# Patient Record
Sex: Male | Born: 1995 | Race: Black or African American | Hispanic: No | Marital: Single | Smoking: Never smoker
Health system: Southern US, Community
[De-identification: ages and names within clinical notes are randomized; demographics above are authoritative.]

## PROBLEM LIST (undated history)

## (undated) DIAGNOSIS — M549 Dorsalgia, unspecified: Secondary | ICD-10-CM

---

## 2019-08-28 ENCOUNTER — Other Ambulatory Visit: Payer: Self-pay

## 2019-08-28 ENCOUNTER — Ambulatory Visit (HOSPITAL_COMMUNITY)
Admission: EM | Admit: 2019-08-28 | Discharge: 2019-08-28 | Disposition: A | Discharge status: Home / Self Care | Payer: Self-pay | Attending: Family Medicine | Admitting: Family Medicine

## 2019-08-28 ENCOUNTER — Encounter (HOSPITAL_COMMUNITY): Payer: Self-pay

## 2019-08-28 DIAGNOSIS — M2392 Unspecified internal derangement of left knee: Secondary | ICD-10-CM

## 2019-08-28 MED ORDER — IBUPROFEN 800 MG PO TABS: 800.0000 mg | ORAL_TABLET | Freq: Three times a day (TID) | ORAL | 0 refills | Status: AC

## 2019-08-28 NOTE — ED Triage Notes (Signed)
Pt states he has left knee pain.  Pt states it's been tightness pt states this has been going on for a month or more.

## 2019-08-28 NOTE — ED Provider Notes (Signed)
[facility]    CSN: [id number] Arrival date & time: 08/28/19  1334      History   Chief Complaint Chief Complaint  Patient presents with  . Knee Pain    HPI Michael Ruiz is a 23 y.o. male.   HPI  Patient states that while in high school he played soccer baseball and football.  He does recall taking some hits to the knees but no real knee injury.  Over the last couple of months he has been having increasing difficulty with his left knee.  It clicks.  It pops.  It locks.  When he changes position frequently it feels as though it is going to buckle.  Sometimes at the end of the day it swollen.  No new injury. History reviewed. No pertinent past medical history.  There are no active problems to display for this patient.   History reviewed. No pertinent surgical history.     Home Medications    Prior to Admission medications   Medication Sig Start Date End Date Taking? Authorizing Provider  ibuprofen (ADVIL) 800 MG tablet Take 1 tablet (800 mg total) by mouth 3 (three) times daily. 08/28/19   Eustace MooreNelson, [name], MD    Family History History reviewed. No pertinent family history.  Social History Social History   Tobacco Use  . Smoking status: Never Smoker  . Smokeless tobacco: Never Used  Substance Use Topics  . Alcohol use: Yes  . Drug use: Never     Allergies   Patient has no known allergies.   Review of Systems Review of Systems  Constitutional: Negative for chills and fever.  HENT: Negative for ear pain and sore throat.   Eyes: Negative for pain and visual disturbance.  Respiratory: Negative for cough and shortness of breath.   Cardiovascular: Negative for chest pain and palpitations.  Gastrointestinal: Negative for abdominal pain and vomiting.  Genitourinary: Negative for dysuria and hematuria.  Musculoskeletal: Positive for arthralgias and gait problem. Negative for back pain.  Skin: Negative for color change and rash.  Neurological:  Negative for seizures and syncope.  All other systems reviewed and are negative.    Physical Exam Triage Vital Signs ED Triage Vitals [08/28/19 1358]  Enc Vitals Group     BP 134/69     Pulse Rate 82     Resp 18     Temp 98.4 F (36.9 C)     Temp Source Oral     SpO2 99 %     Weight 230 lb (104.3 kg)     Height      Head Circumference      Peak Flow      Pain Score 5     Pain Loc      Pain Edu?      Excl. in GC?    No data found.  Updated Vital Signs BP 134/69 (BP Location: Right Arm)   Pulse 82   Temp 98.4 F (36.9 C) (Oral)   Resp 18   Wt 104.3 kg   SpO2 99%   Physical Exam Constitutional:      General: He is not in acute distress.    Appearance: He is well-developed and normal weight.  HENT:     Head: Normocephalic and atraumatic.  Eyes:     Conjunctiva/sclera: Conjunctivae normal.     Pupils: Pupils are equal, round, and reactive to light.  Neck:     Musculoskeletal: Normal range of motion.  Cardiovascular:  Rate and Rhythm: Normal rate.  Pulmonary:     Effort: Pulmonary effort is normal. No respiratory distress.  Abdominal:     General: There is no distension.     Palpations: Abdomen is soft.  Musculoskeletal: Normal range of motion.     Comments: Minimal antalgic gait.  Left knee has warmth.  Trace effusion.  There is crepitus with patellofemoral movement.  Tenderness in the medial and lateral joint line.  Skin:    General: Skin is warm and dry.  Neurological:     Mental Status: He is alert.      UC Treatments / Results  Labs (all labs ordered are listed, but only abnormal results are displayed) Labs Reviewed - No data to display  EKG   Radiology No results found.  Procedures Procedures (including critical care time)  Medications Ordered in UC Medications - No data to display  Initial Impression / Assessment and Plan / UC Course  I have reviewed the triage vital signs and the nursing notes.  Pertinent labs & imaging results  that were available during my care of the patient were reviewed by me and considered in my medical decision making (see chart for details).     I explained to the patient that he has symptoms that are classic for internal derangement within the knee.  He is getting need an orthopedic consultation, imaging, and likely arthroscopic surgery Final Clinical Impressions(s) / UC Diagnoses   Final diagnoses:  Internal derangement of knee joint, left     Discharge Instructions     Take ibuprofen as needed for knee pain.  Take with food Ice will help take down swelling and pain in your knee.  Ice your knee after any exercise or any hard day at work Call to see a sports medicine orthopedic doctor   ED Prescriptions    Medication Sig Dispense Auth. Provider   ibuprofen (ADVIL) 800 MG tablet Take 1 tablet (800 mg total) by mouth 3 (three) times daily. 21 tablet Raylene Everts, MD     Controlled Substance Prescriptions [place] Controlled Substance Registry consulted? No   Raylene Everts, MD 08/28/19 [phone]

## 2019-08-28 NOTE — Discharge Instructions (Signed)
Take ibuprofen as needed for knee pain.  Take with food Ice will help take down swelling and pain in your knee.  Ice your knee after any exercise or any hard day at work Call to see a sports medicine orthopedic doctor

## 2020-12-30 ENCOUNTER — Encounter (HOSPITAL_COMMUNITY): Payer: Self-pay

## 2020-12-30 ENCOUNTER — Other Ambulatory Visit: Payer: Self-pay

## 2020-12-30 ENCOUNTER — Emergency Department (HOSPITAL_COMMUNITY): Payer: Self-pay

## 2020-12-30 ENCOUNTER — Emergency Department (HOSPITAL_COMMUNITY)
Admission: EM | Admit: 2020-12-30 | Discharge: 2020-12-30 | Disposition: A | Discharge status: Home / Self Care | Payer: Self-pay | Attending: Student | Admitting: Student

## 2020-12-30 DIAGNOSIS — R0789 Other chest pain: Secondary | ICD-10-CM | POA: Insufficient documentation

## 2020-12-30 DIAGNOSIS — Z5321 Procedure and treatment not carried out due to patient leaving prior to being seen by health care provider: Secondary | ICD-10-CM | POA: Insufficient documentation

## 2020-12-30 LAB — CBC
HCT: 45.9 % (ref 39.0–52.0)
Hemoglobin: 15.4 g/dL (ref 13.0–17.0)
MCH: 29.1 pg (ref 26.0–34.0)
MCHC: 33.6 g/dL (ref 30.0–36.0)
MCV: 86.8 fL (ref 80.0–100.0)
Platelets: 183 10*3/uL (ref 150–400)
RBC: 5.29 MIL/uL (ref 4.22–5.81)
RDW: 13.1 % (ref 11.5–15.5)
WBC: 4.6 10*3/uL (ref 4.0–10.5)
nRBC: 0 % (ref 0.0–0.2)

## 2020-12-30 LAB — BASIC METABOLIC PANEL
Anion gap: 12 (ref 5–15)
BUN: 16 mg/dL (ref 6–20)
CO2: 24 mmol/L (ref 22–32)
Calcium: 9.4 mg/dL (ref 8.9–10.3)
Chloride: 103 mmol/L (ref 98–111)
Creatinine, Ser: 1.46 mg/dL — ABNORMAL HIGH (ref 0.61–1.24)
GFR, Estimated: >60 mL/min (ref >60)
Glucose, Bld: 104 mg/dL — ABNORMAL HIGH (ref 70–99)
Potassium: 4.3 mmol/L (ref 3.5–5.1)
Sodium: 139 mmol/L (ref 135–145)

## 2020-12-30 LAB — TROPONIN I (HIGH SENSITIVITY): Troponin I (High Sensitivity): 3 ng/L (ref <18)

## 2020-12-30 NOTE — ED Triage Notes (Signed)
Pt is here today due to chest pain 2-3 months. Pt reports I just haven't felt like coming in to get check because it would be an extra bill. Pt reports center chest pain that feels like a squeezing sensation/ cramping.Pt reports tonight it got worse.
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
## 2022-02-08 IMAGING — DX DG CHEST 1V
1 series · 1 of 1 positions shown · non-contrast
Comparison: May 02, 2019

CLINICAL DATA: Chest pain.

EXAM:
CHEST  1 VIEW

[chest]
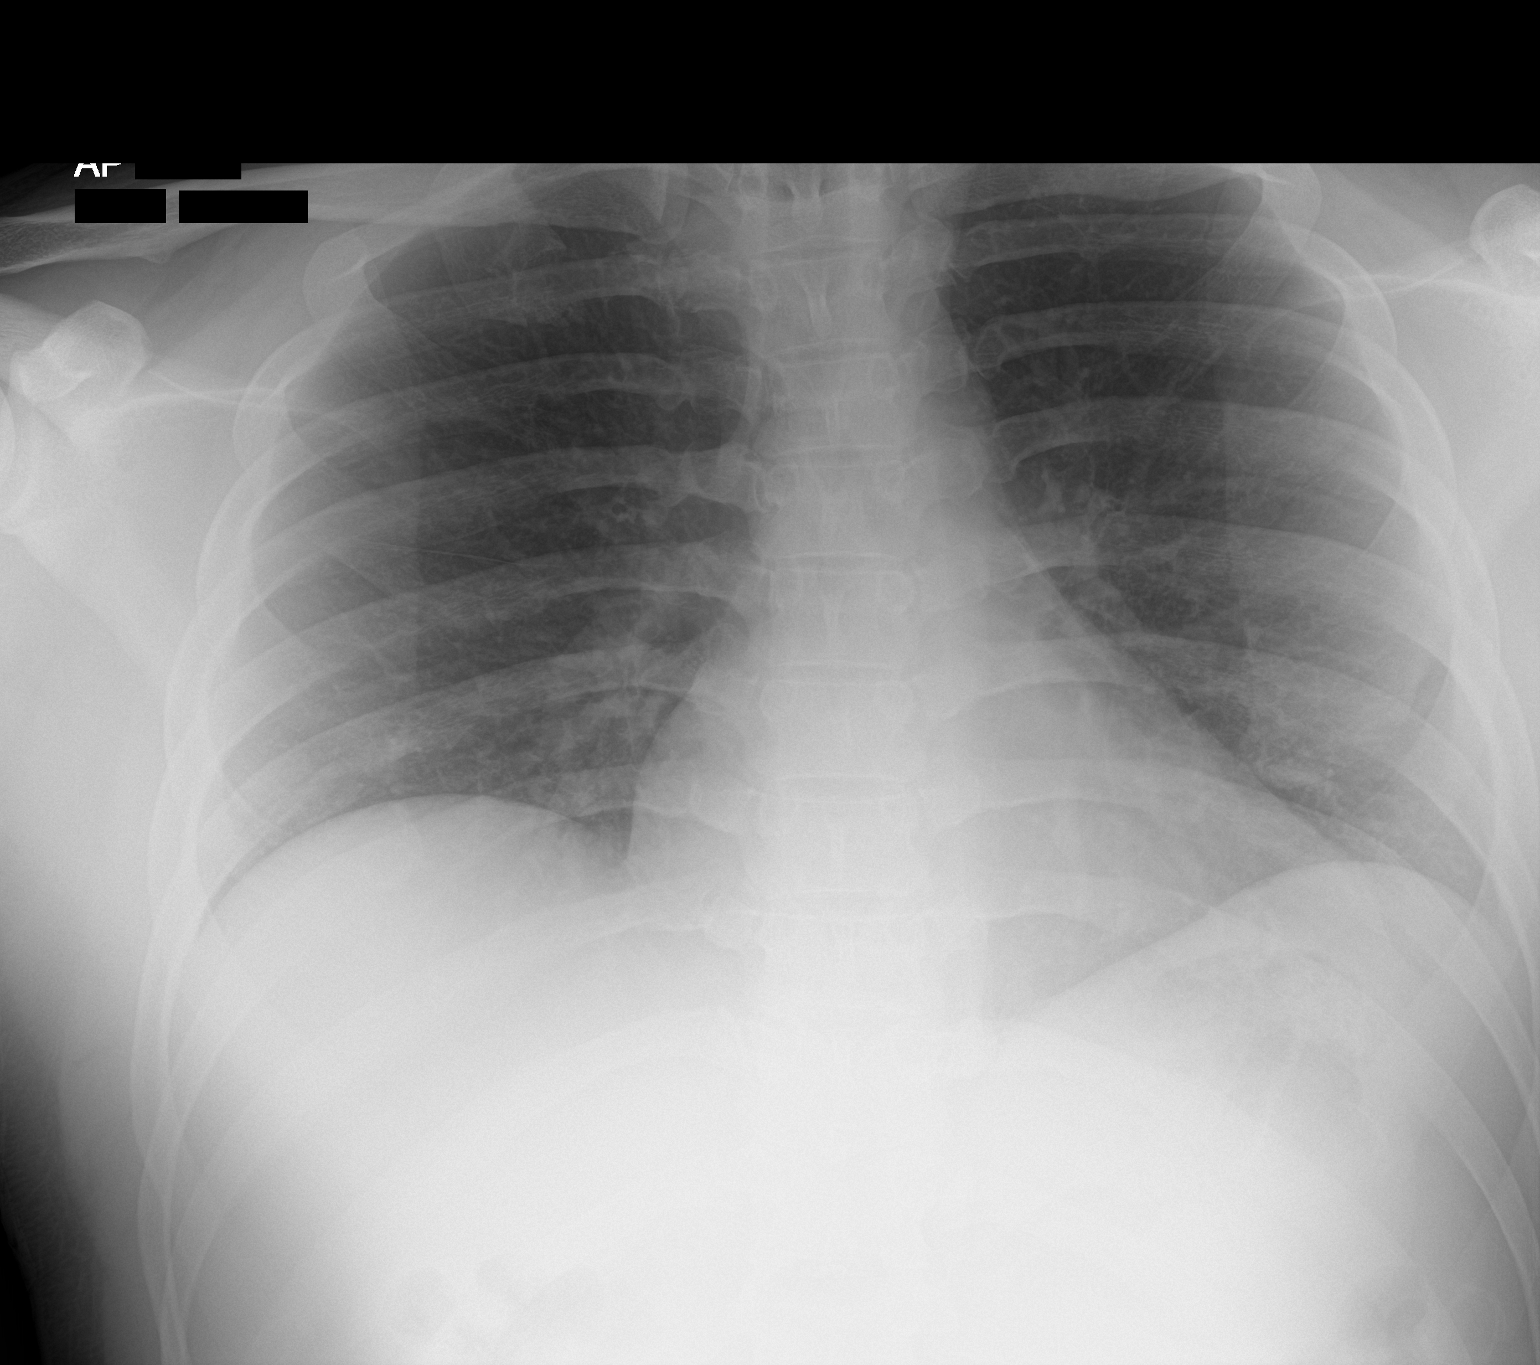

[1 of 1 positions shown; findings below may reference images not displayed]

FINDINGS: The heart size and mediastinal contours are within normal limits.
Both lungs are clear. The visualized skeletal structures are
unremarkable.
IMPRESSION: No active disease.

## 2024-04-01 ENCOUNTER — Other Ambulatory Visit: Payer: Self-pay

## 2024-04-01 ENCOUNTER — Emergency Department (HOSPITAL_COMMUNITY)
Admission: EM | Admit: 2024-04-01 | Discharge: 2024-04-01 | Disposition: A | Discharge status: Home / Self Care | Attending: Emergency Medicine | Admitting: Emergency Medicine

## 2024-04-01 ENCOUNTER — Encounter (HOSPITAL_COMMUNITY): Payer: Self-pay | Admitting: *Deleted

## 2024-04-01 DIAGNOSIS — X500XXA Overexertion from strenuous movement or load, initial encounter: Secondary | ICD-10-CM | POA: Diagnosis not present

## 2024-04-01 DIAGNOSIS — M545 Low back pain, unspecified: Secondary | ICD-10-CM | POA: Diagnosis present

## 2024-04-01 DIAGNOSIS — Y990 Civilian activity done for income or pay: Secondary | ICD-10-CM | POA: Insufficient documentation

## 2024-04-01 DIAGNOSIS — G8929 Other chronic pain: Secondary | ICD-10-CM | POA: Insufficient documentation

## 2024-04-01 HISTORY — DX: Dorsalgia, unspecified: M54.9

## 2024-04-01 MED ORDER — PREDNISONE 10 MG PO TABS: ORAL_TABLET | ORAL | 0 refills | Status: AC

## 2024-04-01 MED ORDER — PREDNISONE 10 MG PO TABS: ORAL_TABLET | ORAL | 0 refills | Status: DC
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
## 2024-04-01 NOTE — Discharge Instructions (Addendum)
 As we discussed, take the prednisone  as prescribed as a trial to see if there is any improvement. Follow up with orthopedics as referred.   If you have any severe pain, numbness, weakness of the legs, difficulty walking or new concern, return to the ED for urgent evaluation.
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
## 2024-04-07 ENCOUNTER — Ambulatory Visit: Admitting: Physical Medicine and Rehabilitation

## 2024-04-07 ENCOUNTER — Other Ambulatory Visit (INDEPENDENT_AMBULATORY_CARE_PROVIDER_SITE_OTHER): Payer: Self-pay

## 2024-04-07 ENCOUNTER — Encounter: Payer: Self-pay | Admitting: Physical Medicine and Rehabilitation

## 2024-04-07 DIAGNOSIS — G8929 Other chronic pain: Secondary | ICD-10-CM

## 2024-04-07 DIAGNOSIS — M7918 Myalgia, other site: Secondary | ICD-10-CM

## 2024-04-07 DIAGNOSIS — M545 Low back pain, unspecified: Secondary | ICD-10-CM | POA: Diagnosis not present
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
## 2024-04-26 ENCOUNTER — Ambulatory Visit: Attending: Physical Medicine and Rehabilitation

## 2024-04-26 DIAGNOSIS — M545 Low back pain, unspecified: Secondary | ICD-10-CM | POA: Diagnosis not present

## 2024-04-26 DIAGNOSIS — M5459 Other low back pain: Secondary | ICD-10-CM | POA: Diagnosis present

## 2024-04-26 DIAGNOSIS — M6281 Muscle weakness (generalized): Secondary | ICD-10-CM | POA: Diagnosis present

## 2024-04-26 DIAGNOSIS — G8929 Other chronic pain: Secondary | ICD-10-CM | POA: Diagnosis not present

## 2024-04-26 DIAGNOSIS — R2689 Other abnormalities of gait and mobility: Secondary | ICD-10-CM | POA: Diagnosis present
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
## 2024-05-10 ENCOUNTER — Ambulatory Visit: Admitting: Physical Therapy

## 2024-05-12 ENCOUNTER — Ambulatory Visit

## 2024-05-12 DIAGNOSIS — M5459 Other low back pain: Secondary | ICD-10-CM | POA: Diagnosis not present

## 2024-05-12 DIAGNOSIS — R2689 Other abnormalities of gait and mobility: Secondary | ICD-10-CM

## 2024-05-12 DIAGNOSIS — M6281 Muscle weakness (generalized): Secondary | ICD-10-CM
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
## 2024-05-17 ENCOUNTER — Ambulatory Visit: Attending: Physical Medicine and Rehabilitation | Admitting: Physical Therapy

## 2024-05-17 ENCOUNTER — Telehealth: Payer: Self-pay | Admitting: Physical Therapy

## 2024-05-17 DIAGNOSIS — R2689 Other abnormalities of gait and mobility: Secondary | ICD-10-CM | POA: Insufficient documentation

## 2024-05-17 DIAGNOSIS — M5459 Other low back pain: Secondary | ICD-10-CM | POA: Insufficient documentation

## 2024-05-17 DIAGNOSIS — M6281 Muscle weakness (generalized): Secondary | ICD-10-CM | POA: Insufficient documentation

## 2024-05-17 NOTE — Telephone Encounter (Signed)
 Left voicemail regarding missed appointment at 7:15 AM. Left reminder of attendance policy and next appointment date/ time.

## 2024-05-19 ENCOUNTER — Encounter: Payer: Self-pay | Admitting: Physical Therapy

## 2024-05-19 ENCOUNTER — Ambulatory Visit: Admitting: Physical Therapy

## 2024-05-19 DIAGNOSIS — R2689 Other abnormalities of gait and mobility: Secondary | ICD-10-CM | POA: Diagnosis present

## 2024-05-19 DIAGNOSIS — M6281 Muscle weakness (generalized): Secondary | ICD-10-CM

## 2024-05-19 DIAGNOSIS — M5459 Other low back pain: Secondary | ICD-10-CM | POA: Diagnosis present
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
## 2024-05-24 ENCOUNTER — Encounter

## 2024-05-26 ENCOUNTER — Encounter: Payer: Self-pay | Admitting: Physical Therapy

## 2024-05-26 ENCOUNTER — Ambulatory Visit: Admitting: Physical Therapy

## 2024-05-26 DIAGNOSIS — M6281 Muscle weakness (generalized): Secondary | ICD-10-CM

## 2024-05-26 DIAGNOSIS — M5459 Other low back pain: Secondary | ICD-10-CM

## 2024-05-26 DIAGNOSIS — R2689 Other abnormalities of gait and mobility: Secondary | ICD-10-CM
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
## 2024-05-27 ENCOUNTER — Ambulatory Visit: Admitting: Physical Therapy

## 2024-05-27 ENCOUNTER — Encounter: Payer: Self-pay | Admitting: Physical Therapy

## 2024-05-27 DIAGNOSIS — M6281 Muscle weakness (generalized): Secondary | ICD-10-CM

## 2024-05-27 DIAGNOSIS — M5459 Other low back pain: Secondary | ICD-10-CM | POA: Diagnosis not present

## 2024-05-27 DIAGNOSIS — R2689 Other abnormalities of gait and mobility: Secondary | ICD-10-CM
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
## 2024-05-30 ENCOUNTER — Ambulatory Visit

## 2024-05-30 DIAGNOSIS — M6281 Muscle weakness (generalized): Secondary | ICD-10-CM

## 2024-05-30 DIAGNOSIS — R2689 Other abnormalities of gait and mobility: Secondary | ICD-10-CM

## 2024-05-30 DIAGNOSIS — M5459 Other low back pain: Secondary | ICD-10-CM | POA: Diagnosis not present
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
## 2024-06-01 ENCOUNTER — Ambulatory Visit

## 2024-06-01 DIAGNOSIS — R2689 Other abnormalities of gait and mobility: Secondary | ICD-10-CM

## 2024-06-01 DIAGNOSIS — M5459 Other low back pain: Secondary | ICD-10-CM | POA: Diagnosis not present

## 2024-06-01 DIAGNOSIS — M6281 Muscle weakness (generalized): Secondary | ICD-10-CM
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
## 2024-06-02 ENCOUNTER — Ambulatory Visit (INDEPENDENT_AMBULATORY_CARE_PROVIDER_SITE_OTHER): Admitting: Physical Medicine and Rehabilitation

## 2024-06-02 ENCOUNTER — Encounter: Payer: Self-pay | Admitting: Physical Medicine and Rehabilitation

## 2024-06-02 DIAGNOSIS — R202 Paresthesia of skin: Secondary | ICD-10-CM

## 2024-06-02 DIAGNOSIS — G8929 Other chronic pain: Secondary | ICD-10-CM

## 2024-06-02 DIAGNOSIS — M7918 Myalgia, other site: Secondary | ICD-10-CM

## 2024-06-02 DIAGNOSIS — M545 Low back pain, unspecified: Secondary | ICD-10-CM | POA: Diagnosis not present

## 2024-06-02 NOTE — Progress Notes (Signed)
 Michael Ruiz - 28 y.o. male MRN [id number]  Date of birth: 04/17/1996  Office Visit Note: Visit Date: 06/02/2024 PCP: Patient, No Pcp Per Referred by: No ref. provider found  Subjective: Chief Complaint  Patient presents with   Lower Back - Follow-up   HPI: Michael Ruiz is a 28 y.o. male who comes in today for evaluation of chronic, worsening and severe bilateral lower back pain, left greater than right.  He is here to day in follow up from short course of formal physical therapy. He reports lower back pain for several years, he also reports new symptom of left leg numbness/tingling. His pain worsens with prolonged sitting and bending. Laying down seems to help alleviate his pain. He describes pain as pinching and sore sensation, currently rates as 5 out of 10. States his pain makes him feel like he could fall to the ground. Some relief of pain with home exercise regimen, rest and use of medications. He recently finished course of physical therapy with minimal relief of pain. Recent lumbar radiographs that shows normal anatomical alignment, well preserved disc spacing, there is mild biforaminal narrowing at L4-L5 and L5-S1, no spondylolisthesis.  Patient voiced to me today that he feels his symptoms could be related to possible prostate issues.  He reports long history of lower back pain, frequent urination and issues with stream of urination.  At this time he is not followed by primary care physician.  Patient denies focal weakness. No recent trauma or falls.      Review of Systems  Musculoskeletal:  Positive for back pain.  Neurological:  Positive for tingling. Negative for focal weakness and weakness.  All other systems reviewed and are negative.  Otherwise per HPI.  Assessment & Plan: Visit Diagnoses:    ICD-10-CM   1. Chronic bilateral low back pain without sciatica  M54.50 MR LUMBAR SPINE WO CONTRAST   G89.29     2. Myofascial pain syndrome  M79.18 MR LUMBAR SPINE WO CONTRAST     3. Paresthesia of skin  R20.2 MR LUMBAR SPINE WO CONTRAST       Plan: Findings:  Chronic, worsening and severe bilateral lower back pain, left greater than right.  Paresthesias to entirety of left lower extremity.  No real radicular pain down the legs.  Patient continues to have severe pain despite good conservative therapy such as formal physical therapy, home exercise regimen, rest and use of medications.  Recent lumbar radiographs look fairly normal for his age.  Patient's clinical presentation and exam are complex, he does have myofascial tenderness noted to bilateral lumbar paraspinal regions on exam today.  His pain continues to bother him on a daily basis and is inhibiting him from performing job duties.  We discussed treatment plan in detail today.  Next step is to obtain lumbar MRI imaging.  Depending on results of MRI we discussed the possibility of performing lumbar injections.  I also discussed medication management, however he prefers to avoid medications at this time.  I did provide him with a note for work while we are waiting on lumbar MRI imaging.  I encouraged him to establish care with a primary care physician to discuss possible prostate issues, he would likely need referral to urologist.  He has no questions at this time.  No red flag symptoms noted upon exam today.    Meds & Orders: No orders of the defined types were placed in this encounter.   Orders Placed This Encounter  Procedures  MR LUMBAR SPINE WO CONTRAST    Follow-up: Return for Lumbar MRI review.   Procedures: No procedures performed      Clinical History: No specialty comments available.   He reports that he has never smoked. He has never used smokeless tobacco. No results for input(s): HGBA1C, LABURIC in the last 8760 hours.  Objective:  VS:  HT:    WT:   BMI:     BP:   HR: bpm  TEMP: ( )  RESP:  Physical Exam Vitals and nursing note reviewed.  HENT:     Head: Normocephalic and  atraumatic.     Right Ear: External ear normal.     Left Ear: External ear normal.     Nose: Nose normal.     Mouth/Throat:     Mouth: Mucous membranes are moist.   Eyes:     Extraocular Movements: Extraocular movements intact.    Cardiovascular:     Rate and Rhythm: Normal rate.     Pulses: Normal pulses.  Pulmonary:     Effort: Pulmonary effort is normal.  Abdominal:     General: Abdomen is flat. There is no distension.   Musculoskeletal:        General: Tenderness present.     Cervical back: Normal range of motion.     Comments: Patient rises from seated position to standing without difficulty. Good lumbar range of motion. No pain noted with facet loading. 5/5 strength noted with bilateral hip flexion, knee flexion/extension, ankle dorsiflexion/plantarflexion and EHL. No clonus noted bilaterally. No pain upon palpation of greater trochanters. No pain with internal/external rotation of bilateral hips. Sensation intact bilaterally. Negative slump test bilaterally. Ambulates without aid, gait steady.      Skin:    General: Skin is warm and dry.     Capillary Refill: Capillary refill takes less than 2 seconds.   Neurological:     General: No focal deficit present.     Mental Status: He is alert and oriented to person, place, and time.   Psychiatric:        Mood and Affect: Mood normal.        Behavior: Behavior normal.     Ortho Exam  Imaging: No results found.  Past Medical/Family/Surgical/Social History: Medications & Allergies reviewed per EMR, new medications updated. There are no active problems to display for this patient.  Past Medical History:  Diagnosis Date   Back pain    No family history on file. No past surgical history on file. Social History   Occupational History   Not on file  Tobacco Use   Smoking status: Never   Smokeless tobacco: Never  Substance and Sexual Activity   Alcohol use: Yes   Drug use: Never   Sexual activity: Yes

## 2024-06-02 NOTE — Progress Notes (Unsigned)
 Pain Scale   Average Pain 6 Patient advising his lower back pain is still bothering and PT is not helping him        +Driver, -BT, -Dye Allergies.

## 2024-06-06 ENCOUNTER — Encounter: Payer: Self-pay | Admitting: Physical Medicine and Rehabilitation

## 2024-06-07 ENCOUNTER — Ambulatory Visit
Admission: RE | Admit: 2024-06-07 | Discharge: 2024-06-07 | Discharge status: Home / Self Care | Source: Ambulatory Visit | Attending: Physical Medicine and Rehabilitation | Admitting: Physical Medicine and Rehabilitation

## 2024-06-07 DIAGNOSIS — M545 Low back pain, unspecified: Secondary | ICD-10-CM

## 2024-06-07 DIAGNOSIS — R202 Paresthesia of skin: Secondary | ICD-10-CM

## 2024-06-07 DIAGNOSIS — M7918 Myalgia, other site: Secondary | ICD-10-CM

## 2024-06-15 ENCOUNTER — Telehealth: Payer: Self-pay

## 2024-06-15 ENCOUNTER — Ambulatory Visit: Payer: Self-pay | Admitting: Physical Medicine and Rehabilitation

## 2024-06-15 NOTE — Telephone Encounter (Signed)
 He needs OV to discuss MRI results, shows L4-5 disc herniation, broad with some narrowing. No frank compression.

## 2024-06-15 NOTE — Telephone Encounter (Signed)
 Patient advising that he is to return to work on July 21, his F/U with Duwaine is July 23 for MRI review. Patient asking if he should stay out of work till he F/U with Duwaine or should he just return to work on July 21. Patient advising he will need a work note to cover extra days.please advise patient.

## 2024-06-20 ENCOUNTER — Encounter: Payer: Self-pay | Admitting: Physical Medicine and Rehabilitation

## 2024-07-06 ENCOUNTER — Encounter: Payer: Self-pay | Admitting: Physical Medicine and Rehabilitation

## 2024-07-06 ENCOUNTER — Ambulatory Visit (INDEPENDENT_AMBULATORY_CARE_PROVIDER_SITE_OTHER): Admitting: Physical Medicine and Rehabilitation

## 2024-07-06 DIAGNOSIS — G8929 Other chronic pain: Secondary | ICD-10-CM | POA: Diagnosis not present

## 2024-07-06 DIAGNOSIS — M5116 Intervertebral disc disorders with radiculopathy, lumbar region: Secondary | ICD-10-CM | POA: Diagnosis not present

## 2024-07-06 DIAGNOSIS — M5416 Radiculopathy, lumbar region: Secondary | ICD-10-CM

## 2024-07-06 NOTE — Progress Notes (Signed)
 Pain Scale   Average Pain 6 Patient advising his lower back pain and his PT did not help with his pain.        +Driver, -BT, -Dye Allergies.

## 2024-07-06 NOTE — Progress Notes (Signed)
 Michael Ruiz - 28 y.o. male MRN [id number]  Date of birth: [birth year]  Office Visit Note: Visit Date: 07/06/2024 PCP: Patient, No Pcp Per Referred by: No ref. provider found  Subjective: Chief Complaint  Patient presents with   Lower Back - Pain   HPI: Sharif Rendell is a 28 y.o. male who comes in today for evaluation of chronic, worsening and severe bilateral lower back pain radiating down left lateral leg. Also reports paresthesias to left leg. Pain ongoing for several years, worsened over the last couple of months. His pain is severe with prolonged sitting and standing. He describes pain as sore and aching sensation, currently rates as 7 out of 10. Some relief of pain with home exercise regimen, rest and use of medications. He recently completed formal physical therapy with minimal relief of pain. Recent lumbar MRI imaging shows broad based central disc protrusion at L4-L5, possibly contacting L5 nerves. No high grade spinal canal stenosis noted. Patient denies focal weakness. No recent trauma or falls.     Review of Systems  Musculoskeletal:  Positive for back pain.  Neurological:  Positive for tingling. Negative for focal weakness and weakness.  All other systems reviewed and are negative.  Otherwise per HPI.  Assessment & Plan: Visit Diagnoses:    ICD-10-CM   1. Chronic bilateral low back pain with left-sided sciatica  G89.29 Ambulatory referral to Physical Medicine Rehab   M54.42     2. Lumbar radiculopathy  M54.16 Ambulatory referral to Physical Medicine Rehab    3. Intervertebral disc disorders with radiculopathy, lumbar region  M51.16 Ambulatory referral to Physical Medicine Rehab       Plan: Findings:  Chronic, worsening and severe bilateral lower back pain radiating down left lateral leg. Paresthesias to left lower extremity. Patient continues to have severe pain despite good conservative therapy such as formal physical therapy, home exercise regimen, rest and use of  medications.  Patient's clinical presentation and exam are consistent with lumbar radiculopathy more of L5 nerve pattern.  I discussed recent lumbar MRI with him today using imaging and spine model.  There is central disc protrusion noted at the level of L4-L5.  We discussed treatment plan in detail today.  Next step is to perform diagnostic and hopefully therapeutic left L5 transforaminal epidural steroid injection under fluoroscopic guidance.  If good relief of pain with injection we can repeat this procedure infrequently as needed.  I discussed injection procedure in detail today, he has no questions at this time.  Dr. Eldonna also at bedside to discuss injection and answer any questions.  Also discussed medication management, however patient would prefer to avoid medications at this time.  He is requesting a note to return to work post injection.  No red flag symptoms noted upon exam today.    Meds & Orders: No orders of the defined types were placed in this encounter.   Orders Placed This Encounter  Procedures   Ambulatory referral to Physical Medicine Rehab    Follow-up: Return for Left L5 transforaminal epidural steroid injection.   Procedures: No procedures performed      Clinical History: MR LUMBAR SPINE WO CONTRAST   CLINICAL HISTORY: Low back pain, symptoms persist with > 6 wks treatment   COMPARISON: None Available.   TECHNIQUE: MRI of the lumbar spine is performed according to our usual protocol with axial and sagittal multi sequence imaging.   FINDINGS: The alignment is unremarkable. Mild degenerative disc disease L4-5. No endplate marrow edema. The marrow  signal is unremarkable as well as the conus and cauda equina.   L3-4 has mild foraminal narrowing on the right due to broad-based disc bulge and facet hypertrophy.   L4-5 has a broad-based central disc protrusion combined with facet/ligament flavum hypertrophy to cause moderate bilateral lateral recess narrowing  and mild to moderate central stenosis. Suspected contact of the L5 nerves. No definitive impingement. Also mild degenerative bilateral foraminal narrowing.   L5-S1 has a small right paracentral protrusion causing mild lateral recess.   The image levels are otherwise unremarkable.   The imaged retroperitoneal structures are unremarkable.     IMPRESSION: L4-5 has a broad-based central disc protrusion contributing to moderate bilateral lateral recess narrowing and mild to moderate central stenosis. Suspected contact of bilateral L5 nerves within the lateral recess.   L5-S1 has a small right paracentral protrusion causing mild lateral recess narrowing on the right.   Electronically signed by: Reyes Frees MD 06/15/2024 09:50 AM EDT RP Workstation: [id number]   He reports that he has never smoked. He has never used smokeless tobacco. No results for input(s): HGBA1C, LABURIC in the last 8760 hours.  Objective:  VS:  HT:    WT:   BMI:     BP:   HR: bpm  TEMP: ( )  RESP:  Physical Exam Vitals and nursing note reviewed.  HENT:     Head: Normocephalic and atraumatic.     Right Ear: External ear normal.     Left Ear: External ear normal.     Nose: Nose normal.     Mouth/Throat:     Mouth: Mucous membranes are moist.  Eyes:     Extraocular Movements: Extraocular movements intact.  Cardiovascular:     Rate and Rhythm: Normal rate.     Pulses: Normal pulses.  Pulmonary:     Effort: Pulmonary effort is normal.  Abdominal:     General: Abdomen is flat. There is no distension.  Musculoskeletal:        General: Tenderness present.     Cervical back: Normal range of motion.     Comments: Patient rises from seated position to standing without difficulty. Good lumbar range of motion. No pain noted with facet loading. 5/5 strength noted with bilateral hip flexion, knee flexion/extension, ankle dorsiflexion/plantarflexion and EHL. No clonus noted bilaterally. No pain upon  palpation of greater trochanters. No pain with internal/external rotation of bilateral hips. Sensation intact bilaterally. Dysesthesias to left L5 dermatome. Positive slump test on the left. Ambulates without aid, gait steady.     Skin:    General: Skin is warm and dry.     Capillary Refill: Capillary refill takes less than 2 seconds.  Neurological:     General: No focal deficit present.     Mental Status: He is alert and oriented to person, place, and time.  Psychiatric:        Mood and Affect: Mood normal.        Behavior: Behavior normal.     Ortho Exam  Imaging: No results found.  Past Medical/Family/Surgical/Social History: Medications & Allergies reviewed per EMR, new medications updated. There are no active problems to display for this patient.  Past Medical History:  Diagnosis Date   Back pain    History reviewed. No pertinent family history. History reviewed. No pertinent surgical history. Social History   Occupational History   Not on file  Tobacco Use   Smoking status: Never   Smokeless tobacco: Never  Substance and Sexual Activity  Alcohol use: Yes   Drug use: Never   Sexual activity: Yes

## 2024-07-08 ENCOUNTER — Telehealth: Payer: Self-pay | Admitting: Physical Medicine and Rehabilitation

## 2024-07-08 NOTE — Telephone Encounter (Signed)
 Received call from patient asking for the 07/06/24 work note be emailed to him. I emailed to pt at [email or web address]

## 2024-08-04 ENCOUNTER — Ambulatory Visit (INDEPENDENT_AMBULATORY_CARE_PROVIDER_SITE_OTHER): Admitting: Physical Medicine and Rehabilitation

## 2024-08-04 ENCOUNTER — Other Ambulatory Visit: Payer: Self-pay

## 2024-08-04 DIAGNOSIS — M5416 Radiculopathy, lumbar region: Secondary | ICD-10-CM

## 2024-08-04 MED ORDER — METHYLPREDNISOLONE ACETATE 40 MG/ML IJ SUSP
40.0000 mg | Freq: Once | INTRAMUSCULAR | Status: AC
  Administered 2024-08-04: 40 mg

## 2024-08-04 NOTE — Progress Notes (Signed)
 Michael Ruiz - 28 y.o. male MRN [id number]  Date of birth: [birth year]  Office Visit Note: Visit Date: 08/04/2024 PCP: Patient, No Pcp Per Referred by: Trudy Duwaine BRAVO, NP  Subjective: Chief Complaint  Patient presents with   Lower Back - Pain   HPI:  Rune Mendez is a 28 y.o. male who comes in today at the request of Duwaine Trudy, FNP for planned Left L5-S1 Lumbar Transforaminal epidural steroid injection with fluoroscopic guidance.  The patient has failed conservative care including home exercise, medications, time and activity modification.  This injection will be diagnostic and hopefully therapeutic.  Please see requesting physician notes for further details and justification.   ROS Otherwise per HPI.  Assessment & Plan: Visit Diagnoses:    ICD-10-CM   1. Lumbar radiculopathy  M54.16 XR C-ARM NO REPORT    Epidural Steroid injection    methylPREDNISolone  acetate (DEPO-MEDROL ) injection 40 mg      Plan: No additional findings.   Meds & Orders:  Meds ordered this encounter  Medications   methylPREDNISolone  acetate (DEPO-MEDROL ) injection 40 mg    Orders Placed This Encounter  Procedures   XR C-ARM NO REPORT   Epidural Steroid injection    Follow-up: Return for visit to requesting provider as needed.   Procedures: No procedures performed  Lumbosacral Transforaminal Epidural Steroid Injection - Sub-Pedicular Approach with Fluoroscopic Guidance  Patient: Korie Brabson      Date of Birth: 1996/03/14 MRN: [id number] PCP: Patient, No Pcp Per      Visit Date: 08/04/2024   Universal Protocol:    Date/Time: 08/04/2024  Consent Given By: the patient  Position: PRONE  Additional Comments: Vital signs were monitored before and after the procedure. Patient was prepped and draped in the usual sterile fashion. The correct patient, procedure, and site was verified.   Injection Procedure Details:   Procedure diagnoses: Lumbar radiculopathy [M54.16]    Meds  Administered:  Meds ordered this encounter  Medications   methylPREDNISolone  acetate (DEPO-MEDROL ) injection 40 mg    Laterality: Left  Location/Site: L5  Needle:5.0 in., 22 ga.  Short bevel or Quincke spinal needle  Needle Placement: Transforaminal  Findings:    -Comments: Excellent flow of contrast along the nerve, nerve root and into the epidural space.  Procedure Details: After squaring off the end-plates to get a true AP view, the C-arm was positioned so that an oblique view of the foramen as noted above was visualized. The target area is just inferior to the nose of the scotty dog or sub pedicular. The soft tissues overlying this structure were infiltrated with 2-3 ml. of 1% Lidocaine without Epinephrine.  The spinal needle was inserted toward the target using a trajectory view along the fluoroscope beam.  Under AP and lateral visualization, the needle was advanced so it did not puncture dura and was located close the 6 O'Clock position of the pedical in AP tracterory. Biplanar projections were used to confirm position. Aspiration was confirmed to be negative for CSF and/or blood. A 1-2 ml. volume of Isovue-250 was injected and flow of contrast was noted at each level. Radiographs were obtained for documentation purposes.   After attaining the desired flow of contrast documented above, a 0.5 to 1.0 ml test dose of 0.25% Marcaine was injected into each respective transforaminal space.  The patient was observed for 90 seconds post injection.  After no sensory deficits were reported, and normal lower extremity motor function was noted,   the above injectate was administered so  that equal amounts of the injectate were placed at each foramen (level) into the transforaminal epidural space.   Additional Comments:  The patient tolerated the procedure well Dressing: 2 x 2 sterile gauze and Band-Aid    Post-procedure details: Patient was observed during the procedure. Post-procedure  instructions were reviewed.  Patient left the clinic in stable condition.     Clinical History: MR LUMBAR SPINE WO CONTRAST   CLINICAL HISTORY: Low back pain, symptoms persist with > 6 wks treatment   COMPARISON: None Available.   TECHNIQUE: MRI of the lumbar spine is performed according to our usual protocol with axial and sagittal multi sequence imaging.   FINDINGS: The alignment is unremarkable. Mild degenerative disc disease L4-5. No endplate marrow edema. The marrow signal is unremarkable as well as the conus and cauda equina.   L3-4 has mild foraminal narrowing on the right due to broad-based disc bulge and facet hypertrophy.   L4-5 has a broad-based central disc protrusion combined with facet/ligament flavum hypertrophy to cause moderate bilateral lateral recess narrowing and mild to moderate central stenosis. Suspected contact of the L5 nerves. No definitive impingement. Also mild degenerative bilateral foraminal narrowing.   L5-S1 has a small right paracentral protrusion causing mild lateral recess.   The image levels are otherwise unremarkable.   The imaged retroperitoneal structures are unremarkable.     IMPRESSION: L4-5 has a broad-based central disc protrusion contributing to moderate bilateral lateral recess narrowing and mild to moderate central stenosis. Suspected contact of bilateral L5 nerves within the lateral recess.   L5-S1 has a small right paracentral protrusion causing mild lateral recess narrowing on the right.   Electronically signed by: Reyes Frees MD 06/15/2024 09:50 AM EDT RP Workstation: [id number]     Objective:  VS:  HT:    WT:   BMI:     BP:   HR: bpm  TEMP: ( )  RESP:  Physical Exam Vitals and nursing note reviewed.  Constitutional:      General: He is not in acute distress.    Appearance: Normal appearance. He is not ill-appearing.  HENT:     Head: Normocephalic and atraumatic.     Right Ear: External ear normal.      Left Ear: External ear normal.     Nose: No congestion.  Eyes:     Extraocular Movements: Extraocular movements intact.  Cardiovascular:     Rate and Rhythm: Normal rate.     Pulses: Normal pulses.  Pulmonary:     Effort: Pulmonary effort is normal. No respiratory distress.  Abdominal:     General: There is no distension.     Palpations: Abdomen is soft.  Musculoskeletal:        General: No tenderness or signs of injury.     Cervical back: Neck supple.     Right lower leg: No edema.     Left lower leg: No edema.     Comments: Patient has good distal strength without clonus.  Skin:    Findings: No erythema or rash.  Neurological:     General: No focal deficit present.     Mental Status: He is alert and oriented to person, place, and time.     Sensory: No sensory deficit.     Motor: No weakness or abnormal muscle tone.     Coordination: Coordination normal.  Psychiatric:        Mood and Affect: Mood normal.        Behavior: Behavior normal.  Imaging: XR C-ARM NO REPORT Result Date: 08/04/2024 Please see Notes tab for imaging impression.

## 2024-08-04 NOTE — Procedures (Signed)
 Lumbosacral Transforaminal Epidural Steroid Injection - Sub-Pedicular Approach with Fluoroscopic Guidance  Patient: Michael Ruiz      Date of Birth: 1996-08-15 MRN: [id number] PCP: Patient, No Pcp Per      Visit Date: 08/04/2024   Universal Protocol:    Date/Time: 08/04/2024  Consent Given By: the patient  Position: PRONE  Additional Comments: Vital signs were monitored before and after the procedure. Patient was prepped and draped in the usual sterile fashion. The correct patient, procedure, and site was verified.   Injection Procedure Details:   Procedure diagnoses: Lumbar radiculopathy [M54.16]    Meds Administered:  Meds ordered this encounter  Medications   methylPREDNISolone  acetate (DEPO-MEDROL ) injection 40 mg    Laterality: Left  Location/Site: L5  Needle:5.0 in., 22 ga.  Short bevel or Quincke spinal needle  Needle Placement: Transforaminal  Findings:    -Comments: Excellent flow of contrast along the nerve, nerve root and into the epidural space.  Procedure Details: After squaring off the end-plates to get a true AP view, the C-arm was positioned so that an oblique view of the foramen as noted above was visualized. The target area is just inferior to the nose of the scotty dog or sub pedicular. The soft tissues overlying this structure were infiltrated with 2-3 ml. of 1% Lidocaine without Epinephrine.  The spinal needle was inserted toward the target using a trajectory view along the fluoroscope beam.  Under AP and lateral visualization, the needle was advanced so it did not puncture dura and was located close the 6 O'Clock position of the pedical in AP tracterory. Biplanar projections were used to confirm position. Aspiration was confirmed to be negative for CSF and/or blood. A 1-2 ml. volume of Isovue-250 was injected and flow of contrast was noted at each level. Radiographs were obtained for documentation purposes.   After attaining the desired flow of  contrast documented above, a 0.5 to 1.0 ml test dose of 0.25% Marcaine was injected into each respective transforaminal space.  The patient was observed for 90 seconds post injection.  After no sensory deficits were reported, and normal lower extremity motor function was noted,   the above injectate was administered so that equal amounts of the injectate were placed at each foramen (level) into the transforaminal epidural space.   Additional Comments:  The patient tolerated the procedure well Dressing: 2 x 2 sterile gauze and Band-Aid    Post-procedure details: Patient was observed during the procedure. Post-procedure instructions were reviewed.  Patient left the clinic in stable condition.

## 2024-08-04 NOTE — Progress Notes (Signed)
 Pain Scale   Average Pain 6   Low back pain, mostly left side.  Radiating pain/numbness to left leg Cannot stand/walk long periods of time     -Driver, -BT, -Dye Allergies.

## 2024-08-04 NOTE — Patient Instructions (Signed)

## 2024-08-11 ENCOUNTER — Telehealth: Payer: Self-pay

## 2024-08-11 NOTE — Telephone Encounter (Signed)
 Patient called to ask if he could get a work note stating he would be returning to work next week after his appt with you depending on your exam. MyChart message will be fine.

## 2024-08-17 ENCOUNTER — Ambulatory Visit (INDEPENDENT_AMBULATORY_CARE_PROVIDER_SITE_OTHER): Admitting: Physical Medicine and Rehabilitation

## 2024-08-17 ENCOUNTER — Encounter: Payer: Self-pay | Admitting: Physical Medicine and Rehabilitation

## 2024-08-17 DIAGNOSIS — G8929 Other chronic pain: Secondary | ICD-10-CM

## 2024-08-17 DIAGNOSIS — M5116 Intervertebral disc disorders with radiculopathy, lumbar region: Secondary | ICD-10-CM | POA: Diagnosis not present

## 2024-08-17 DIAGNOSIS — M5442 Lumbago with sciatica, left side: Secondary | ICD-10-CM | POA: Diagnosis not present

## 2024-08-17 DIAGNOSIS — R202 Paresthesia of skin: Secondary | ICD-10-CM | POA: Diagnosis not present

## 2024-08-17 DIAGNOSIS — M5416 Radiculopathy, lumbar region: Secondary | ICD-10-CM

## 2024-08-17 NOTE — Progress Notes (Signed)
 Michael Ruiz - 28 y.o. male MRN [id number]  Date of birth: 1996-06-13  Office Visit Note: Visit Date: 08/17/2024 PCP: Patient, No Pcp Per Referred by: No ref. provider found  Subjective: Chief Complaint  Patient presents with   Lower Back - Follow-up   HPI: Michael Ruiz is a 28 y.o. male who comes in today for evaluation of chronic, worsening and severe bilateral lower back pain radiating down left lateral leg. Also reports numbness/tingling to left leg. He underwent left L5 transforaminal epidural steroid injection in our office on 08/04/2024. He reports significant relief of pain, greater than 80% that continues to sustain. He reports continued pressure to lower back and intermittent numbness to left leg, however he feels much better. Recent lumbar MRI imaging shows broad based central disc protrusion at L4-L5, possibly contacting L5 nerves. No high grade spinal canal stenosis noted. 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 He would like to return to work, however his job is not accommodating to restrictions. He is requesting to be out of work at this time. Patient denies focal weakness. No recent trauma or falls.      Review of Systems  Musculoskeletal:  Positive for back pain.  Neurological:  Negative for tingling, sensory change, focal weakness and weakness.  All other systems reviewed and are negative.  Otherwise per HPI.  Assessment & Plan: Visit Diagnoses:    ICD-10-CM   1. Chronic bilateral low back pain with left-sided sciatica  G89.29 Ambulatory referral to Orthopedic Surgery   M54.42     2. Lumbar radiculopathy  M54.16 Ambulatory referral to Orthopedic Surgery    3. Intervertebral disc disorders with radiculopathy, lumbar region  M51.16 Ambulatory referral to Orthopedic Surgery    4. Paresthesia of skin  R20.2 Ambulatory referral to Orthopedic Surgery       Plan: Findings:  Chronic bilateral lower back pain radiating down left  lateral leg. Recent lumbar epidural steroid injection significantly helped to alleviate his lower back pain. He continues to experience intermittent numbness to left leg and pressure sensation to lower back, however the majority of his pain has subsided. He is here today to discuss work restrictions and options for returning to work. His job is physically demanding and he feels that he will not be able to return due to difficulty lifting and bending. He is also concerned about being on his feet for long periods of time. I think at this point he continues to experience functional limitations, I will go ahead and take him out of work and place referral to our spine surgeon Dr. Ozell Ada. There is broad-based central disc protrusion contributing to moderate bilateral lateral recess narrowing and mild to moderate central stenosis. I explained to him that disc herniations do eventually re-absorb over time. We will see what Dr. Ada thinks regarding his progression and possible surgical options. Patient has no questions at this time. No red flag symptoms noted upon exam today.      Meds & Orders: No orders of the defined types were placed in this encounter.   Orders Placed This Encounter  Procedures   Ambulatory referral to Orthopedic Surgery    Follow-up: Return for Follow up wtih Dr. Ada.   Procedures: No procedures performed      Clinical History: MR LUMBAR SPINE WO CONTRAST   CLINICAL HISTORY: Low back pain, symptoms persist with > 6 wks treatment   COMPARISON: None Available.   TECHNIQUE: MRI of the lumbar spine is  performed according to our usual protocol with axial and sagittal multi sequence imaging.   FINDINGS: The alignment is unremarkable. Mild degenerative disc disease L4-5. No endplate marrow edema. The marrow signal is unremarkable as well as the conus and cauda equina.   L3-4 has mild foraminal narrowing on the right due to broad-based disc bulge and facet  hypertrophy.   L4-5 has a broad-based central disc protrusion combined with facet/ligament flavum hypertrophy to cause moderate bilateral lateral recess narrowing and mild to moderate central stenosis. Suspected contact of the L5 nerves. No definitive impingement. Also mild degenerative bilateral foraminal narrowing.   L5-S1 has a small right paracentral protrusion causing mild lateral recess.   The image levels are otherwise unremarkable.   The imaged retroperitoneal structures are unremarkable.     IMPRESSION: L4-5 has a broad-based central disc protrusion contributing to moderate bilateral lateral recess narrowing and mild to moderate central stenosis. Suspected contact of bilateral L5 nerves within the lateral recess.   L5-S1 has a small right paracentral protrusion causing mild lateral recess narrowing on the right.   Electronically signed by: Michael Frees MD 06/15/2024 09:50 AM EDT RP Workstation: [id number]   He reports that he has never smoked. He has never used smokeless tobacco. No results for input(s): HGBA1C, LABURIC in the last 8760 hours.  Objective:  VS:  HT:    WT:   BMI:     BP:   HR: bpm  TEMP: ( )  RESP:  Physical Exam Vitals and nursing note reviewed.  HENT:     Head: Normocephalic and atraumatic.     Right Ear: External ear normal.     Left Ear: External ear normal.     Nose: Nose normal.     Mouth/Throat:     Mouth: Mucous membranes are moist.  Eyes:     Extraocular Movements: Extraocular movements intact.  Cardiovascular:     Rate and Rhythm: Normal rate.     Pulses: Normal pulses.  Pulmonary:     Effort: Pulmonary effort is normal.  Abdominal:     General: Abdomen is flat. There is no distension.  Musculoskeletal:        General: Tenderness present.     Cervical back: Normal range of motion.     Comments: Patient rises from seated position to standing without difficulty. Good lumbar range of motion. No pain noted with facet  loading. 5/5 strength noted with bilateral hip flexion, knee flexion/extension, ankle dorsiflexion/plantarflexion and EHL. No clonus noted bilaterally. No pain upon palpation of greater trochanters. No pain with internal/external rotation of bilateral hips. Sensation intact bilaterally. Negative slump test bilaterally. Ambulates without aid, gait steady.     Skin:    General: Skin is warm and dry.     Capillary Refill: Capillary refill takes less than 2 seconds.  Neurological:     General: No focal deficit present.     Mental Status: He is alert and oriented to person, place, and time.  Psychiatric:        Mood and Affect: Mood normal.        Behavior: Behavior normal.     Ortho Exam  Imaging: No results found.  Past Medical/Family/Surgical/Social History: Medications & Allergies reviewed per EMR, new medications updated. There are no active problems to display for this patient.  Past Medical History:  Diagnosis Date   Back pain    History reviewed. No pertinent family history. History reviewed. No pertinent surgical history. Social History   Occupational History  Not on file  Tobacco Use   Smoking status: Never   Smokeless tobacco: Never  Substance and Sexual Activity   Alcohol use: Yes   Drug use: Never   Sexual activity: Yes

## 2024-08-17 NOTE — Progress Notes (Signed)
 Pain Scale   Average Pain 0 Patient advising he has no pain although he does have some numbness in his left leg. Patient advising he wants to return to work.        +Driver, -BT, -Dye Allergies.

## 2024-09-22 ENCOUNTER — Other Ambulatory Visit: Payer: Self-pay

## 2024-09-22 ENCOUNTER — Ambulatory Visit: Admitting: Orthopedic Surgery

## 2024-09-22 VITALS — BP 135/81 | HR 89 | Ht 75.0 in | Wt 273.2 lb

## 2024-09-22 DIAGNOSIS — M5416 Radiculopathy, lumbar region: Secondary | ICD-10-CM

## 2024-09-22 DIAGNOSIS — M545 Low back pain, unspecified: Secondary | ICD-10-CM

## 2024-09-22 NOTE — Progress Notes (Signed)
 Orthopedic Spine Surgery Office Note  Assessment: Patient is a 28 y.o. male with low back pain that goes into the left groin and left anterior thigh.  Numbness distal to the knee.  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  His numbness is not in any distribution since it is in multiple distributions distally.  He did not respond to a transforaminal injections I am not convinced that his pain is related to the lateral recess stenosis seen at L4/5.  Ordered an EMG/NCS for further workup -Patient has tried PT, Tylenol, lumbar steroid injection -Patient should return to office in 4 weeks, x-rays at next visit: None   Patient expressed understanding of the plan and all questions were answered to the patient's satisfaction.   ___________________________________________________________________________   History:  Patient is a 28 y.o. male who presents today for lumbar spine.  Patient has had back pain since March 2025.  He has also had pain going into the left groin and left anterior thigh.  He has noticed numbness distal to the knee in multiple distributions.  No pain going into the right lower extremity.  Has not noticed any improvement with conservative treatments.  Also, had an injection with Dr. Eldonna to the L5 nerve root that did not give him any relief.  There is no trauma or injury that preceded the onset of his pain.   Weakness: Denies Symptoms of imbalance: Denies Paresthesias and numbness: Yes, has decreased sensation distal to the knee in multiple distributions.  No other numbness or paresthesias Bowel or bladder incontinence: Denies Saddle anesthesia: Denies  Treatments tried: PT, Tylenol, lumbar steroid injection  Review of systems: Denies fevers and chills, night sweats, unexplained weight loss, history of cancer, pain that wakes him at night  Past medical history: Anxiety GERD  Allergies: NKDA  Past surgical  history:  None  Social history: Denies use of nicotine product (smoking, vaping, patches, smokeless) Alcohol use: Denies Denies recreational drug use   Physical Exam:  BMI of 34.2  General: no acute distress, appears stated age Neurologic: alert, answering questions appropriately, following commands Respiratory: unlabored breathing on room air, symmetric chest rise Psychiatric: appropriate affect, normal cadence to speech   MSK (spine):  -Strength exam      Left  Right EHL    5/5  5/5 TA    5/5  5/5 GSC    5/5  5/5 Knee extension  5/5  5/5 Hip flexion   5/5  5/5  -Sensory exam    Sensation intact to light touch in L3-S1 nerve distributions of bilateral lower extremities  -Achilles DTR: 2/4 on the left, 2/4 on the right -Patellar tendon DTR: 2/4 on the left, 2/4 on the right  -Straight leg raise: Negative bilaterally -Clonus: no beats bilaterally  -Left hip exam: No pain through range of motion -Right hip exam: No pain through range of motion  Imaging: XRs of the lumbar spine from 09/22/2024 were independently reviewed and interpreted, showing no significant degenerative changes.  No evidence of instability on flexion/extension views.  No fracture or dislocation seen.  MRI of the lumbar spine from 06/07/2024 was independently reviewed and interpreted, showing DDD at L4/5.  Central disc herniation at L4/5 causing bilateral lateral recess stenosis.  No other significant stenosis seen.   Patient name: Michael Ruiz Patient MRN: [id number] Date of visit: 09/22/24

## 2024-10-17 ENCOUNTER — Encounter: Payer: Self-pay | Admitting: Neurology

## 2024-10-17 ENCOUNTER — Other Ambulatory Visit: Payer: Self-pay

## 2024-10-17 ENCOUNTER — Encounter: Payer: Self-pay | Admitting: Radiology

## 2024-10-17 DIAGNOSIS — R202 Paresthesia of skin: Secondary | ICD-10-CM

## 2024-10-21 ENCOUNTER — Encounter: Payer: Self-pay | Admitting: Orthopedic Surgery

## 2024-11-14 ENCOUNTER — Ambulatory Visit (HOSPITAL_COMMUNITY)

## 2024-11-17 ENCOUNTER — Ambulatory Visit (HOSPITAL_COMMUNITY)

## 2024-11-25 ENCOUNTER — Ambulatory Visit (HOSPITAL_COMMUNITY)

## 2024-12-12 ENCOUNTER — Other Ambulatory Visit: Payer: Self-pay

## 2024-12-12 ENCOUNTER — Ambulatory Visit

## 2024-12-12 ENCOUNTER — Emergency Department (HOSPITAL_BASED_OUTPATIENT_CLINIC_OR_DEPARTMENT_OTHER)
Admission: EM | Admit: 2024-12-12 | Discharge: 2024-12-12 | Disposition: A | Discharge status: Home / Self Care | Attending: Emergency Medicine | Admitting: Emergency Medicine

## 2024-12-12 ENCOUNTER — Emergency Department (HOSPITAL_BASED_OUTPATIENT_CLINIC_OR_DEPARTMENT_OTHER)

## 2024-12-12 ENCOUNTER — Encounter (HOSPITAL_BASED_OUTPATIENT_CLINIC_OR_DEPARTMENT_OTHER): Payer: Self-pay | Admitting: Emergency Medicine

## 2024-12-12 DIAGNOSIS — R0789 Other chest pain: Secondary | ICD-10-CM | POA: Insufficient documentation

## 2024-12-12 LAB — CBC WITH DIFFERENTIAL/PLATELET
Abs Immature Granulocytes: 0.01 K/uL (ref 0.00–0.07)
Basophils Absolute: 0 K/uL (ref 0.0–0.1)
Basophils Relative: 1 %
Eosinophils Absolute: 0.2 K/uL (ref 0.0–0.5)
Eosinophils Relative: 3 %
HCT: 41.5 % (ref 39.0–52.0)
Hemoglobin: 13.1 g/dL (ref 13.0–17.0)
Immature Granulocytes: 0 %
Lymphocytes Relative: 38 %
Lymphs Abs: 2 K/uL (ref 0.7–4.0)
MCH: 26 pg (ref 26.0–34.0)
MCHC: 31.6 g/dL (ref 30.0–36.0)
MCV: 82.3 fL (ref 80.0–100.0)
Monocytes Absolute: 0.5 K/uL (ref 0.1–1.0)
Monocytes Relative: 9 %
Neutro Abs: 2.6 K/uL (ref 1.7–7.7)
Neutrophils Relative %: 49 %
Platelets: 198 K/uL (ref 150–400)
RBC: 5.04 MIL/uL (ref 4.22–5.81)
RDW: 13.9 % (ref 11.5–15.5)
WBC: 5.3 K/uL (ref 4.0–10.5)
nRBC: 0 % (ref 0.0–0.2)

## 2024-12-12 LAB — COMPREHENSIVE METABOLIC PANEL WITH GFR
ALT: 28 U/L (ref 0–44)
AST: 30 U/L (ref 15–41)
Albumin: 4.4 g/dL (ref 3.5–5.0)
Alkaline Phosphatase: 66 U/L (ref 38–126)
Anion gap: 11 (ref 5–15)
BUN: 18 mg/dL (ref 6–20)
CO2: 27 mmol/L (ref 22–32)
Calcium: 9.3 mg/dL (ref 8.9–10.3)
Chloride: 103 mmol/L (ref 98–111)
Creatinine, Ser: 1.27 mg/dL — ABNORMAL HIGH (ref 0.61–1.24)
GFR, Estimated: >60 mL/min
Glucose, Bld: 130 mg/dL — ABNORMAL HIGH (ref 70–99)
Potassium: 4 mmol/L (ref 3.5–5.1)
Sodium: 141 mmol/L (ref 135–145)
Total Bilirubin: 0.2 mg/dL (ref 0.0–1.2)
Total Protein: 7.7 g/dL (ref 6.5–8.1)

## 2024-12-12 LAB — LIPASE, BLOOD: Lipase: 36 U/L (ref 11–51)

## 2024-12-12 LAB — TROPONIN T, HIGH SENSITIVITY: Troponin T High Sensitivity: <15 ng/L (ref 0–19)

## 2024-12-12 MED ORDER — NAPROXEN 500 MG PO TABS: 500.0000 mg | ORAL_TABLET | Freq: Two times a day (BID) | ORAL | 0 refills | Status: AC

## 2024-12-12 MED ORDER — DICLOFENAC SODIUM 1 % EX GEL: 2.0000 g | Freq: Four times a day (QID) | CUTANEOUS | 0 refills | Status: AC | PRN

## 2024-12-12 MED ORDER — METHOCARBAMOL 500 MG PO TABS: 500.0000 mg | ORAL_TABLET | Freq: Three times a day (TID) | ORAL | 0 refills | Status: AC | PRN

## 2024-12-12 MED ORDER — KETOROLAC TROMETHAMINE 30 MG/ML IJ SOLN
30.0000 mg | Freq: Once | INTRAMUSCULAR | Status: AC
  Administered 2024-12-12: 30 mg via INTRAVENOUS
  Filled 2024-12-12: qty 1

## 2024-12-12 NOTE — ED Provider Notes (Signed)
 "  Emergency Department Provider Note   I have reviewed the triage vital signs and the nursing notes.   HISTORY  Chief Complaint Chest Pain   HPI Michael Ruiz is a 28 y.o. male past history of back pain presents emergency department with months of intermittent left chest pressure.  He states pain is only present when he is still for example in the car or specially lying in bed.  Pain is worse with lying on his left side.  When he is having discomfort it radiates into his neck and left arm.  No numbness.  He feels a pinching pain in the left lateral/anterior neck.  No posterior neck tenderness.  No pleuritic sharp pain in the chest or shortness of breath.  Past Medical History:  Diagnosis Date   Back pain     Review of Systems  Constitutional: No fever/chills Cardiovascular: Positive chest pain. Respiratory: Denies shortness of breath. Gastrointestinal: No abdominal pain.  No nausea, no vomiting.  No diarrhea.   Musculoskeletal: Negative for back pain. Skin: Negative for rash. Neurological: Negative for headaches.   ____________________________________________   PHYSICAL EXAM:  VITAL SIGNS: ED Triage Vitals  Encounter Vitals Group     BP 12/12/24 0314 (!) 167/97     Pulse Rate 12/12/24 0314 85     Resp 12/12/24 0314 16     Temp 12/12/24 0314 98.9 F (37.2 C)     Temp Source 12/12/24 0314 Oral     SpO2 12/12/24 0314 98 %     Weight 12/12/24 0318 270 lb (122.5 kg)     Height 12/12/24 0318 6' 3 (1.905 m)   Constitutional: Alert and oriented. Well appearing and in no acute distress. Eyes: Conjunctivae are normal.  Head: Atraumatic. Nose: No congestion/rhinnorhea. Mouth/Throat: Mucous membranes are moist.   Neck: No stridor.   Cardiovascular: Normal rate, regular rhythm. Good peripheral circulation. Grossly normal heart sounds. 2+ radial pulses.  Respiratory: Normal respiratory effort.  No retractions. Lungs CTAB. Gastrointestinal: Soft and nontender. No  distention.  Musculoskeletal: No lower extremity tenderness nor edema. No gross deformities of extremities. Compartments in the LUE are soft.  Neurologic:  Normal speech and language. No gross focal neurologic deficits are appreciated.  Skin:  Skin is warm, dry and intact. No rash noted.  ____________________________________________   LABS (all labs ordered are listed, but only abnormal results are displayed)  Labs Reviewed  COMPREHENSIVE METABOLIC PANEL WITH GFR - Abnormal; Notable for the following components:      Result Value   Glucose, Bld 130 (*)    Creatinine, Ser 1.27 (*)    All other components within normal limits  LIPASE, BLOOD  CBC WITH DIFFERENTIAL/PLATELET  TROPONIN T, HIGH SENSITIVITY   ____________________________________________  EKG   EKG Interpretation Date/Time:  Monday December 12 2024 03:18:32 EST Ventricular Rate:  91 PR Interval:  126 QRS Duration:  94 QT Interval:  328 QTC Calculation: 404 R Axis:   85  Text Interpretation: Sinus rhythm Nonspecific T abnormalities, inferior leads Similar to 2022 tracing Confirmed by Darra Chew [phone]) on 12/12/2024 3:33:52 AM        ____________________________________________  RADIOLOGY  DG Chest 2 View Result Date: 12/12/2024 EXAM: 2 VIEW(S) XRAY OF THE CHEST 12/12/2024 03:48:00 AM COMPARISON: Previous portable chest x-ray 12/30/2020. CLINICAL HISTORY: 28 year old male with intermittent left side chest pain for months. FINDINGS: LUNGS AND PLEURA: Stable low lung volumes. Mild crowding of lung markings. No focal pulmonary opacity. No pleural effusion. No pneumothorax. HEART AND MEDIASTINUM:  No acute abnormality of the cardiac and mediastinal silhouettes. BONES AND SOFT TISSUES: No acute osseous abnormality. UPPER ABDOMEN: Negative visible bowel gas. IMPRESSION: 1. No acute cardiopulmonary abnormality. Electronically signed by: Helayne Hurst MD 12/12/2024 04:06 AM EST RP Workstation: [id number]     ____________________________________________   PROCEDURES  Procedure(s) performed:   Procedures  None  ____________________________________________   INITIAL IMPRESSION / ASSESSMENT AND PLAN / ED COURSE  Pertinent labs & imaging results that were available during my care of the patient were reviewed by me and considered in my medical decision making (see chart for details).   This patient is Presenting for Evaluation of CP, which does require a range of treatment options, and is a complaint that involves a high risk of morbidity and mortality.  The Differential Diagnoses includes but is not exclusive to acute coronary syndrome, aortic dissection, pulmonary embolism, cardiac tamponade, community-acquired pneumonia, pericarditis, musculoskeletal chest wall pain, etc.   Critical Interventions-    Medications  ketorolac  (TORADOL ) 30 MG/ML injection 30 mg (has no administration in time range)    Reassessment after intervention:  pain improved.   Clinical Laboratory Tests Ordered, included troponin negative. Creatine 1.27. Normal K. No anemia.   Radiologic Tests Ordered, included CXR. I independently interpreted the images and agree with radiology interpretation.   Cardiac Monitor Tracing which shows NSR.    Social Determinants of Health Risk patient is a non-smoker.   Medical Decision Making: Summary:  Patient presents emergency department with very atypical chest pain for ACS.  Seems positional and mainly present with lying in bed.  Vitals are unremarkable.  Patient is low risk by Wells and PERC negative for PE.  Plan for single troponin with months of positional discomfort and chest x-ray along with LFTs and lipase.   Reevaluation with update and discussion with patient. Symptoms improved. Plan for NSAIDs and MSK relaxer at home to help with sleep. Will need close PCP follow up. Low suspicion for ACS. Strict ED return precautions discussed along with the drowsy side  effects of Robaxin . Considered admit but ED workup is reassuring.   Patient's presentation is most consistent with acute presentation with potential threat to life or bodily function.   Disposition: discharge  ____________________________________________  FINAL CLINICAL IMPRESSION(S) / ED DIAGNOSES  Final diagnoses:  Atypical chest pain     NEW OUTPATIENT MEDICATIONS STARTED DURING THIS VISIT:  New Prescriptions   DICLOFENAC SODIUM (VOLTAREN) 1 % GEL    Apply 2 g topically 4 (four) times daily as needed.   METHOCARBAMOL  (ROBAXIN ) 500 MG TABLET    Take 1 tablet (500 mg total) by mouth every 8 (eight) hours as needed for muscle spasms.   NAPROXEN (NAPROSYN) 500 MG TABLET    Take 1 tablet (500 mg total) by mouth 2 (two) times daily for 14 days.    Note:  This document was prepared using Dragon voice recognition software and may include unintentional dictation errors.  Fonda Law, MD, [facility] Emergency Medicine    Harlan Vinal, Fonda MATSU, MD 12/12/24 [phone]  "

## 2024-12-12 NOTE — ED Notes (Signed)
 Patient transported to X-ray

## 2024-12-12 NOTE — Discharge Instructions (Signed)

## 2024-12-12 NOTE — ED Triage Notes (Addendum)
 L sided CP intermittent for months. He states when he is feeling it and lays down it feels like it goes up his neck on the L. No known family hx of early cardiac problems. Pt denies shob, diaphoresis, lightheadedness or weakness. States he gets overheated. VSS. Pt is A&O. 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

## 2024-12-23 ENCOUNTER — Ambulatory Visit (INDEPENDENT_AMBULATORY_CARE_PROVIDER_SITE_OTHER): Admitting: Neurology

## 2024-12-23 DIAGNOSIS — R202 Paresthesia of skin: Secondary | ICD-10-CM

## 2024-12-23 NOTE — Procedures (Signed)
" °  [facility] Neurology  [address] [place], [address]  [place], [place] [address] Tel: [phone] Fax: [phone] Test Date:  12/23/2024  Patient: Michael Ruiz DOB: [birth year] Physician: Tonita Blanch, DO  Sex: Male Height: 6' 3 Ref Phys: Ozell Ada, MD  ID#: [id number]   Technician:    History: This is a 29 year old man referred for evaluation of left leg pain.  NCV & EMG Findings: Extensive electrodiagnostic testing of the left lower extremity shows:  Left sural and superficial peroneal sensory responses are within normal limits. Left peroneal and bilateral tibial motor responses are within normal limits. Left tibial H reflex study is within normal limits. There is no evidence of active or chronic motor axonal loss changes affecting any of the tested muscles.  Motor unit configuration and recruitment pattern is within normal limits.  Impression: This is a normal study of the left lower extremity.  In particular, there is no evidence of a lumbosacral radiculopathy or large fiber sensorimotor polyneuropathy.   ___________________________ Tonita Blanch, DO    Nerve Conduction Studies   Stim Site NR Peak (ms) Norm Peak (ms) O-P Amp (V) Norm O-P Amp  Left Sup Peroneal Anti Sensory (Ant Lat Mall)  32 C  12 cm    2.3 <4.4 16.8 >6  Left Sural Anti Sensory (Lat Mall)  32 C  Calf    2.7 <4.4 20.2 >6     Stim Site NR Onset (ms) Norm Onset (ms) O-P Amp (mV) Norm O-P Amp Site1 Site2 Delta-0 (ms) Dist (cm) Vel (m/s) Norm Vel (m/s)  Left Peroneal Motor (Ext Dig Brev)  32 C  Ankle    4.8 <5.5 8.7 >3 B Fib Ankle 8.0 38.0 48 >41  B Fib    12.8  8.2  Poplt B Fib 1.5 9.0 60 >41  Poplt    14.3  8.0         Left Tibial Motor (Abd Hall Brev)  32 C  Ankle    5.1 <5.8 8.1 >8 Knee Ankle 10.3 42.0 41 >41  Knee    15.4  6.2         Right Tibial Motor (Abd Hall Brev)  32 C  Ankle    4.3 <5.8 8.8 >8 Knee Ankle 9.9 42.0 42 >41  Knee    14.2  5.5          Electromyography   Side  Muscle Ins.Act Fibs Fasc Recrt Amp Dur Poly Activation Comment  Left AntTibialis Nml Nml Nml Nml Nml Nml Nml Nml N/A  Left Gastroc Nml Nml Nml Nml Nml Nml Nml Nml N/A  Left Flex Dig Long Nml Nml Nml Nml Nml Nml Nml Nml N/A  Left BicepsFemS Nml Nml Nml Nml Nml Nml Nml Nml N/A  Left RectFemoris Nml Nml Nml Nml Nml Nml Nml Nml N/A  Left GluteusMed Nml Nml Nml Nml Nml Nml Nml Nml N/A      Waveforms:               "

## 2025-01-03 ENCOUNTER — Encounter: Payer: Self-pay | Admitting: Orthopedic Surgery
